# Patient Record
Sex: Male | Born: 1935 | Race: White | Hispanic: No | Marital: Married
Health system: Southern US, Community
[De-identification: ages and names within clinical notes are randomized; demographics above are authoritative.]

---

## 2001-07-16 ENCOUNTER — Ambulatory Visit (HOSPITAL_COMMUNITY): Admission: RE | Admit: 2001-07-16 | Discharge: 2001-07-16 | Payer: Self-pay | Admitting: Internal Medicine

## 2008-11-22 ENCOUNTER — Inpatient Hospital Stay: Admission: AD | Admit: 2008-11-22 | Discharge: 2008-12-22 | Payer: Self-pay | Admitting: Internal Medicine

## 2008-12-19 ENCOUNTER — Ambulatory Visit (HOSPITAL_COMMUNITY): Admission: RE | Admit: 2008-12-19 | Discharge: 2008-12-19 | Payer: Self-pay | Admitting: Internal Medicine

## 2008-12-20 ENCOUNTER — Ambulatory Visit (HOSPITAL_COMMUNITY): Admission: RE | Admit: 2008-12-20 | Discharge: 2008-12-20 | Payer: Self-pay | Admitting: Internal Medicine

## 2008-12-22 ENCOUNTER — Ambulatory Visit (HOSPITAL_COMMUNITY): Admission: RE | Admit: 2008-12-22 | Discharge: 2008-12-22 | Payer: Self-pay | Admitting: Internal Medicine

## 2008-12-22 ENCOUNTER — Inpatient Hospital Stay (HOSPITAL_COMMUNITY): Admission: EM | Admit: 2008-12-22 | Discharge: 2009-01-01 | Payer: Self-pay | Admitting: Emergency Medicine

## 2009-01-01 ENCOUNTER — Inpatient Hospital Stay: Admission: AD | Admit: 2009-01-01 | Discharge: 2009-06-13 | Payer: Self-pay | Admitting: Internal Medicine

## 2009-04-02 ENCOUNTER — Emergency Department (HOSPITAL_COMMUNITY): Admission: EM | Admit: 2009-04-02 | Discharge: 2009-04-02 | Payer: Self-pay | Admitting: Emergency Medicine

## 2009-04-09 ENCOUNTER — Ambulatory Visit (HOSPITAL_COMMUNITY): Admission: RE | Admit: 2009-04-09 | Discharge: 2009-04-09 | Payer: Self-pay | Admitting: Internal Medicine

## 2009-04-11 DIAGNOSIS — I495 Sick sinus syndrome: Secondary | ICD-10-CM | POA: Insufficient documentation

## 2009-04-11 DIAGNOSIS — G20A1 Parkinson's disease without dyskinesia, without mention of fluctuations: Secondary | ICD-10-CM | POA: Insufficient documentation

## 2009-04-11 DIAGNOSIS — N19 Unspecified kidney failure: Secondary | ICD-10-CM | POA: Insufficient documentation

## 2009-04-11 DIAGNOSIS — G20 Parkinson's disease: Secondary | ICD-10-CM

## 2009-04-11 DIAGNOSIS — E876 Hypokalemia: Secondary | ICD-10-CM

## 2009-04-11 DIAGNOSIS — Z87448 Personal history of other diseases of urinary system: Secondary | ICD-10-CM

## 2009-04-11 DIAGNOSIS — I4891 Unspecified atrial fibrillation: Secondary | ICD-10-CM

## 2009-04-11 DIAGNOSIS — J189 Pneumonia, unspecified organism: Secondary | ICD-10-CM

## 2009-04-18 ENCOUNTER — Ambulatory Visit: Payer: Self-pay | Admitting: Internal Medicine

## 2009-04-18 DIAGNOSIS — Z950 Presence of cardiac pacemaker: Secondary | ICD-10-CM

## 2009-04-20 ENCOUNTER — Encounter: Payer: Self-pay | Admitting: Cardiology

## 2009-05-14 ENCOUNTER — Emergency Department (HOSPITAL_COMMUNITY): Admission: EM | Admit: 2009-05-14 | Discharge: 2009-05-15 | Payer: Self-pay | Admitting: Emergency Medicine

## 2009-05-22 ENCOUNTER — Ambulatory Visit (HOSPITAL_COMMUNITY): Admission: RE | Admit: 2009-05-22 | Discharge: 2009-05-22 | Payer: Self-pay | Admitting: Internal Medicine

## 2009-06-13 ENCOUNTER — Inpatient Hospital Stay (HOSPITAL_COMMUNITY): Admission: EM | Admit: 2009-06-13 | Discharge: 2009-06-26 | Payer: Self-pay | Admitting: Emergency Medicine

## 2009-06-26 ENCOUNTER — Inpatient Hospital Stay: Admission: AD | Admit: 2009-06-26 | Discharge: 2009-07-09 | Payer: Self-pay | Admitting: Internal Medicine

## 2009-07-20 DEATH — deceased

## 2010-02-21 NOTE — Assessment & Plan Note (Signed)
Summary: per Dr.Roy Ouida Sills to evaluate for Pacemaker/tg   Visit Type:  Initial Consult Primary Provider:  Osborne Casco   History of Present Illness: Larry Copeland is referred today by Dr. Ouida Sills for ongoing PPM evaluation.  He has a h/o bradycardia and chronic atrial fibrillation and has been on coumadin for many years.  He recently moved from [place] to be closer to family.  He resides in the nursing facility.  The patient cannot speak except for minimal words and phrases.  His daughter in law who is with him states that she thinks he has been falling some.  Current Medications (verified): 1)  Protonix 40 Mg Tbec (Pantoprazole Sodium) .... Take 1 Tab Daily 2)  Sertraline Hcl 25 Mg Tabs (Sertraline Hcl) .... Take 1 Tab Daily 3)  Cardizem Cd 300 Mg Xr24h-Cap (Diltiazem Hcl Coated Beads) .... Take 1 Tab Daily 4)  Colace 100 Mg Caps (Docusate Sodium) .... Take 1 Tab Daily 5)  Aricept 10 Mg Tabs (Donepezil Hcl) .... Take 1 Tab Nightly 6)  Mucinex Dm 30-600 Mg Xr12h-Tab (Dextromethorphan-Guaifenesin) .... Take 1 Tab Two Times A Day 7)  Zebeta 5 Mg Tabs (Bisoprolol Fumarate) .... Take 1/2 Tab Daily 8)  Seroquel 25 Mg Tabs (Quetiapine Fumarate) .... Take As Needed 9)  Ipratropium-Albuterol 0.5-2.5 (3) Mg/53ml Soln (Ipratropium-Albuterol) .... Take As Directed 10)  Ziac 2.5-6.25 Mg Tabs (Bisoprolol-Hydrochlorothiazide) .... Take 1/2 Tab Daily 11)  Zoloft 25 Mg Tabs (Sertraline Hcl) .... Take 1 Tab Daily 12)  Jantoven 5 Mg Tabs (Warfarin Sodium) .... Take 1 Tab Daily  Allergies (verified): No Known Drug Allergies  Past History:  Past Medical History: Last updated: 04/11/2009 Current Problems:  HYPOKALEMIA (ICD-276.8) ATRIAL FIBRILLATION, CHRONIC (ICD-427.31) PARKINSON'S DISEASE (ICD-332.0) SICK SINUS SYNDROME (ICD-427.81) PNEUMONIA (ICD-486) RENAL FAILURE (ICD-586) URINARY TRACT OBSTRUCTION, HX OF (ICD-V13.09)  Past Surgical History: Last updated: 04/11/2009 pacemaker  placement indwelling catheter bladder tumor resection post left hemicolectomy  Family History: Last updated: 04/11/2009 Father:coronary artery disease Mother:cerebral hemorrhage  Social History: Last updated: 04/11/2009 Retired  Married  Tobacco Use - No.  Alcohol Use - no Regular Exercise - no Drug Use - no  Review of Systems       Unable to obtain ROS secondary to his dementia  Vital Signs:  Patient profile:   77 year old male Pulse rate:   59 / minute BP sitting:   115 / 65  (right arm)  Vitals Entered By: Dreama Saa, CNA (April 18, 2009 1:59 PM)  Physical Exam  General:  Elderly, demented in no acute distress.  HEENT: normal Neck: supple. No JVD. Carotids 2+ bilaterally no bruits EAV:WUJWJ no rubs, gallops or murmur Lungs: CTA. Well healed PPM incision. Ab: soft, nontender. nondistended. No HSM. Good bowel sounds Ext: warm. no cyanosis, clubbing or edema Neuro: alert but unable to answer questions. Occaisionally follows commands.    EKG  Procedure date:  04/18/2009  Findings:      Atrial fibrillation with a controlled ventricular response rate of: 60. Ventricle is paced intermittently   PPM Specifications Following MD:  Lewayne Bunting, MD     PPM Vendor:  [employer]     PPM Model Number:  [id number]     PPM Serial Number:  [id number] PPM DOI:  11/14/2008     PPM Implanting MD:  NOT IMPLANTED HERE  Lead 1    Location: RV     DOI: 05/08/2003     Model #: [id number]     Serial #: [id number]  Status: active  Magnet Response Rate:  BOL 98.6 ERI  86.3  Indications:  A-FIB   PPM Follow Up Remote Check?  No Battery Voltage:  2.78 V     Battery Est. Longevity:  7 years     Pacer Dependent:  No     Right Ventricle  Amplitude: 5.0 mV, Impedance: 303 ohms, Threshold: 0.625 V at 0.5 msec  Episodes Coumadin:  Yes Ventricular Pacing:  61%  Parameters Mode:  VVI     Lower Rate Limit:  60     Next Cardiology Appt Due:  09/20/2009 Tech Comments:  Auto capture on today.   New ID card order and to be sent to [facility]).  A-fib with controlled ventricular rates, + coumadin.  ROV 6 months in the [facility] clinic. Altha Harm, LPN  April 18, 2009 3:07 PM   MD Comments:  Agree with above.  Impression & Recommendations:  Problem # 1:  ATRIAL FIBRILLATION, CHRONIC (ICD-427.31) His ventricular rate is well controlled.  Continue meds as below except that if he continues to fall, coumadin will need to be stopped. The following medications were removed from the medication list:    Warfarin Sodium 7.5 Mg Tabs (Warfarin sodium) .Marland Kitchen... Take as directed His updated medication list for this problem includes:    Zebeta 5 Mg Tabs (Bisoprolol fumarate) .Marland Kitchen... Take 1/2 tab daily    Ziac 2.5-6.25 Mg Tabs (Bisoprolol-hydrochlorothiazide) .Marland Kitchen... Take 1/2 tab daily    Jantoven 5 Mg Tabs (Warfarin sodium) .Marland Kitchen... Take 1 tab daily  Problem # 2:  CARDIAC PACEMAKER IN SITU (ICD-V45.01) His device is working normally. Will recheck in several months.

## 2010-02-21 NOTE — Cardiovascular Report (Signed)
Summary: Card Device Clinic  Card Device Clinic   Imported By: Dreama Saa, CNA 04/20/2009 10:09:10  _____________________________________________________________________  External Attachment:    Type:   Image     Comment:   External Document

## 2010-04-08 LAB — BLOOD GAS, ARTERIAL
Acid-Base Excess: 0.9 mmol/L (ref 0.0–2.0)
Acid-base deficit: 2.8 mmol/L — ABNORMAL HIGH (ref 0.0–2.0)
Bicarbonate: 24.6 mEq/L — ABNORMAL HIGH (ref 20.0–24.0)
Bicarbonate: 21.9 mEq/L (ref 20.0–24.0)
O2 Saturation: 95.3 %
TCO2: 20.2 mmol/L (ref 0–100)
pCO2 arterial: 40.9 mmHg (ref 35.0–45.0)
pH, Arterial: 7.445 (ref 7.350–7.450)
pH, Arterial: 7.348 — ABNORMAL LOW (ref 7.350–7.450)
pO2, Arterial: 72.7 mmHg — ABNORMAL LOW (ref 80.0–100.0)

## 2010-04-08 LAB — BASIC METABOLIC PANEL
BUN: 14 mg/dL (ref 6–23)
BUN: 7 mg/dL (ref 6–23)
BUN: 7 mg/dL (ref 6–23)
BUN: 7 mg/dL (ref 6–23)
BUN: 8 mg/dL (ref 6–23)
BUN: 12 mg/dL (ref 6–23)
BUN: 17 mg/dL (ref 6–23)
BUN: 22 mg/dL (ref 6–23)
BUN: 9 mg/dL (ref 6–23)
CO2: 26 mEq/L (ref 19–32)
CO2: 26 mEq/L (ref 19–32)
CO2: 27 mEq/L (ref 19–32)
CO2: 22 mEq/L (ref 19–32)
CO2: 22 mEq/L (ref 19–32)
CO2: 22 mEq/L (ref 19–32)
CO2: 24 mEq/L (ref 19–32)
Calcium: 9.5 mg/dL (ref 8.4–10.5)
Calcium: 9.5 mg/dL (ref 8.4–10.5)
Calcium: 9.8 mg/dL (ref 8.4–10.5)
Calcium: 9.2 mg/dL (ref 8.4–10.5)
Calcium: 9.5 mg/dL (ref 8.4–10.5)
Chloride: 107 mEq/L (ref 96–112)
Chloride: 109 mEq/L (ref 96–112)
Chloride: 111 mEq/L (ref 96–112)
Chloride: 112 mEq/L (ref 96–112)
Chloride: 107 mEq/L (ref 96–112)
Chloride: 110 mEq/L (ref 96–112)
Chloride: 117 mEq/L — ABNORMAL HIGH (ref 96–112)
Creatinine, Ser: 0.97 mg/dL (ref 0.4–1.5)
Creatinine, Ser: 1.02 mg/dL (ref 0.4–1.5)
Creatinine, Ser: 1.13 mg/dL (ref 0.4–1.5)
Creatinine, Ser: 1.24 mg/dL (ref 0.4–1.5)
Creatinine, Ser: 0.74 mg/dL (ref 0.4–1.5)
Creatinine, Ser: 0.79 mg/dL (ref 0.4–1.5)
Creatinine, Ser: 0.87 mg/dL (ref 0.4–1.5)
GFR calc Af Amer: >60 mL/min (ref >60)
GFR calc Af Amer: >60 mL/min (ref >60)
GFR calc Af Amer: >60 mL/min (ref >60)
GFR calc non Af Amer: >60 mL/min (ref >60)
GFR calc non Af Amer: >60 mL/min (ref >60)
GFR calc non Af Amer: >60 mL/min (ref >60)
GFR calc non Af Amer: >60 mL/min (ref >60)
Glucose, Bld: 89 mg/dL (ref 70–99)
Glucose, Bld: 93 mg/dL (ref 70–99)
Glucose, Bld: 96 mg/dL (ref 70–99)
Glucose, Bld: 106 mg/dL — ABNORMAL HIGH (ref 70–99)
Glucose, Bld: 121 mg/dL — ABNORMAL HIGH (ref 70–99)
Glucose, Bld: 98 mg/dL (ref 70–99)
Potassium: 3.5 mEq/L (ref 3.5–5.1)
Potassium: 3 mEq/L — ABNORMAL LOW (ref 3.5–5.1)
Potassium: 3.3 mEq/L — ABNORMAL LOW (ref 3.5–5.1)
Potassium: 3.6 mEq/L (ref 3.5–5.1)
Potassium: 3.6 mEq/L (ref 3.5–5.1)
Potassium: 4.1 mEq/L (ref 3.5–5.1)
Sodium: 138 mEq/L (ref 135–145)
Sodium: 142 mEq/L (ref 135–145)

## 2010-04-08 LAB — CBC
HCT: 31.1 % — ABNORMAL LOW (ref 39.0–52.0)
HCT: 31.7 % — ABNORMAL LOW (ref 39.0–52.0)
HCT: 31.5 % — ABNORMAL LOW (ref 39.0–52.0)
HCT: 32.1 % — ABNORMAL LOW (ref 39.0–52.0)
HCT: 32.6 % — ABNORMAL LOW (ref 39.0–52.0)
HCT: 34.2 % — ABNORMAL LOW (ref 39.0–52.0)
Hemoglobin: 10.4 g/dL — ABNORMAL LOW (ref 13.0–17.0)
Hemoglobin: 10.4 g/dL — ABNORMAL LOW (ref 13.0–17.0)
Hemoglobin: 10.9 g/dL — ABNORMAL LOW (ref 13.0–17.0)
Hemoglobin: 11.2 g/dL — ABNORMAL LOW (ref 13.0–17.0)
Hemoglobin: 11.3 g/dL — ABNORMAL LOW (ref 13.0–17.0)
Hemoglobin: 12.2 g/dL — ABNORMAL LOW (ref 13.0–17.0)
MCHC: 32.9 g/dL (ref 30.0–36.0)
MCHC: 33.4 g/dL (ref 30.0–36.0)
MCHC: 32.9 g/dL (ref 30.0–36.0)
MCHC: 32.9 g/dL (ref 30.0–36.0)
MCHC: 33.3 g/dL (ref 30.0–36.0)
MCHC: 33.5 g/dL (ref 30.0–36.0)
MCV: 87.3 fL (ref 78.0–100.0)
MCV: 87.6 fL (ref 78.0–100.0)
MCV: 87.8 fL (ref 78.0–100.0)
MCV: 86.5 fL (ref 78.0–100.0)
MCV: 87.4 fL (ref 78.0–100.0)
MCV: 88 fL (ref 78.0–100.0)
MCV: 88.2 fL (ref 78.0–100.0)
Platelets: 190 10*3/uL (ref 150–400)
Platelets: 210 10*3/uL (ref 150–400)
Platelets: 212 10*3/uL (ref 150–400)
RBC: 3.61 MIL/uL — ABNORMAL LOW (ref 4.22–5.81)
RBC: 4.01 MIL/uL — ABNORMAL LOW (ref 4.22–5.81)
RBC: 3.77 MIL/uL — ABNORMAL LOW (ref 4.22–5.81)
RBC: 3.89 MIL/uL — ABNORMAL LOW (ref 4.22–5.81)
RBC: 4.22 MIL/uL (ref 4.22–5.81)
RDW: 16.3 % — ABNORMAL HIGH (ref 11.5–15.5)
RDW: 16.6 % — ABNORMAL HIGH (ref 11.5–15.5)
RDW: 16.3 % — ABNORMAL HIGH (ref 11.5–15.5)
RDW: 16.3 % — ABNORMAL HIGH (ref 11.5–15.5)
RDW: 16.3 % — ABNORMAL HIGH (ref 11.5–15.5)
RDW: 16.6 % — ABNORMAL HIGH (ref 11.5–15.5)
WBC: 7.3 10*3/uL (ref 4.0–10.5)
WBC: 7.5 10*3/uL (ref 4.0–10.5)
WBC: 7.7 10*3/uL (ref 4.0–10.5)
WBC: 10.9 10*3/uL — ABNORMAL HIGH (ref 4.0–10.5)
WBC: 5.5 10*3/uL (ref 4.0–10.5)
WBC: 7.6 10*3/uL (ref 4.0–10.5)
WBC: 8.3 10*3/uL (ref 4.0–10.5)

## 2010-04-08 LAB — DIFFERENTIAL
Band Neutrophils: 0 % (ref 0–10)
Basophils Absolute: 0 10*3/uL (ref 0.0–0.1)
Basophils Absolute: 0 10*3/uL (ref 0.0–0.1)
Basophils Absolute: 0 10*3/uL (ref 0.0–0.1)
Basophils Absolute: 0 10*3/uL (ref 0.0–0.1)
Basophils Absolute: 0 10*3/uL (ref 0.0–0.1)
Basophils Absolute: 0 10*3/uL (ref 0.0–0.1)
Basophils Relative: 0 % (ref 0–1)
Basophils Relative: 0 % (ref 0–1)
Basophils Relative: 0 % (ref 0–1)
Basophils Relative: 0 % (ref 0–1)
Basophils Relative: 0 % (ref 0–1)
Basophils Relative: 0 % (ref 0–1)
Basophils Relative: 1 % (ref 0–1)
Basophils Relative: 1 % (ref 0–1)
Blasts: 0 %
Eosinophils Absolute: 0.5 10*3/uL (ref 0.0–0.7)
Eosinophils Absolute: 0.6 10*3/uL (ref 0.0–0.7)
Eosinophils Absolute: 0.8 10*3/uL — ABNORMAL HIGH (ref 0.0–0.7)
Eosinophils Absolute: 0 10*3/uL (ref 0.0–0.7)
Eosinophils Absolute: 0.1 10*3/uL (ref 0.0–0.7)
Eosinophils Absolute: 0.5 10*3/uL (ref 0.0–0.7)
Eosinophils Absolute: 0.6 10*3/uL (ref 0.0–0.7)
Eosinophils Relative: 13 % — ABNORMAL HIGH (ref 0–5)
Eosinophils Relative: 8 % — ABNORMAL HIGH (ref 0–5)
Eosinophils Relative: 0 % (ref 0–5)
Eosinophils Relative: 0 % (ref 0–5)
Eosinophils Relative: 10 % — ABNORMAL HIGH (ref 0–5)
Lymphocytes Relative: 15 % (ref 12–46)
Lymphocytes Relative: 9 % — ABNORMAL LOW (ref 12–46)
Lymphs Abs: 1 10*3/uL (ref 0.7–4.0)
Lymphs Abs: 0.8 10*3/uL (ref 0.7–4.0)
Lymphs Abs: 0.8 10*3/uL (ref 0.7–4.0)
Lymphs Abs: 0.9 10*3/uL (ref 0.7–4.0)
Metamyelocytes Relative: 0 %
Monocytes Absolute: 0.7 10*3/uL (ref 0.1–1.0)
Monocytes Absolute: 0.8 10*3/uL (ref 0.1–1.0)
Monocytes Absolute: 0.8 10*3/uL (ref 0.1–1.0)
Monocytes Absolute: 0.9 10*3/uL (ref 0.1–1.0)
Monocytes Absolute: 0.7 10*3/uL (ref 0.1–1.0)
Monocytes Absolute: 0.7 10*3/uL (ref 0.1–1.0)
Monocytes Absolute: 0.8 10*3/uL (ref 0.1–1.0)
Monocytes Absolute: 0.8 10*3/uL (ref 0.1–1.0)
Monocytes Relative: 10 % (ref 3–12)
Monocytes Relative: 10 % (ref 3–12)
Monocytes Relative: 12 % (ref 3–12)
Monocytes Relative: 11 % (ref 3–12)
Monocytes Relative: 13 % — ABNORMAL HIGH (ref 3–12)
Monocytes Relative: 4 % (ref 3–12)
Myelocytes: 0 %
Neutro Abs: 4.3 10*3/uL (ref 1.7–7.7)
Neutro Abs: 3.4 10*3/uL (ref 1.7–7.7)
Neutro Abs: 6.4 10*3/uL (ref 1.7–7.7)
Neutrophils Relative %: 61 % (ref 43–77)
Neutrophils Relative %: 64 % (ref 43–77)
Neutrophils Relative %: 68 % (ref 43–77)
Neutrophils Relative %: 60 % (ref 43–77)
Neutrophils Relative %: 68 % (ref 43–77)
Neutrophils Relative %: 77 % (ref 43–77)
Neutrophils Relative %: 86 % — ABNORMAL HIGH (ref 43–77)
nRBC: 0 /100 WBC

## 2010-04-08 LAB — PROTIME-INR
INR: 1.42 (ref 0.00–1.49)
INR: 3.19 — ABNORMAL HIGH (ref 0.00–1.49)
INR: 3.54 — ABNORMAL HIGH (ref 0.00–1.49)
INR: 4.11 — ABNORMAL HIGH (ref 0.00–1.49)
Prothrombin Time: 35.2 seconds — ABNORMAL HIGH (ref 11.6–15.2)
Prothrombin Time: 42.9 seconds — ABNORMAL HIGH (ref 11.6–15.2)

## 2010-04-08 LAB — COMPREHENSIVE METABOLIC PANEL
ALT: 11 U/L (ref 0–53)
Alkaline Phosphatase: 103 U/L (ref 39–117)
CO2: 25 mEq/L (ref 19–32)
Chloride: 109 mEq/L (ref 96–112)
GFR calc non Af Amer: >60 mL/min (ref >60)
Glucose, Bld: 134 mg/dL — ABNORMAL HIGH (ref 70–99)
Potassium: 4.2 mEq/L (ref 3.5–5.1)
Sodium: 141 mEq/L (ref 135–145)
Total Bilirubin: 0.5 mg/dL (ref 0.3–1.2)
Total Protein: 7.5 g/dL (ref 6.0–8.3)

## 2010-04-08 LAB — CULTURE, BLOOD (ROUTINE X 2)
Culture: NO GROWTH
Report Status: 5302011
Report Status: 5302011

## 2010-04-08 LAB — URINE CULTURE: Colony Count: 30000

## 2010-04-08 LAB — VANCOMYCIN, TROUGH: Vancomycin Tr: 29.6 ug/mL (ref 10.0–20.0)

## 2010-04-08 LAB — URINALYSIS, ROUTINE W REFLEX MICROSCOPIC
Bilirubin Urine: NEGATIVE
Ketones, ur: NEGATIVE mg/dL
Nitrite: NEGATIVE
Urobilinogen, UA: 0.2 mg/dL (ref 0.0–1.0)

## 2010-04-08 LAB — POCT CARDIAC MARKERS: CKMB, poc: <1 ng/mL — ABNORMAL LOW (ref 1.0–8.0)

## 2010-04-08 LAB — URINE MICROSCOPIC-ADD ON

## 2010-04-08 LAB — BRAIN NATRIURETIC PEPTIDE: Pro B Natriuretic peptide (BNP): 68.2 pg/mL (ref 0.0–100.0)

## 2010-04-09 LAB — URINALYSIS, ROUTINE W REFLEX MICROSCOPIC
Bilirubin Urine: NEGATIVE
Ketones, ur: NEGATIVE mg/dL
Nitrite: NEGATIVE
Protein, ur: 100 mg/dL — AB
pH: 6.5 (ref 5.0–8.0)

## 2010-04-09 LAB — URINE CULTURE
Colony Count: NO GROWTH
Culture: NO GROWTH

## 2010-04-09 LAB — URINE MICROSCOPIC-ADD ON

## 2010-04-23 LAB — BASIC METABOLIC PANEL
BUN: 10 mg/dL (ref 6–23)
BUN: 15 mg/dL (ref 6–23)
CO2: 22 mEq/L (ref 19–32)
CO2: 23 mEq/L (ref 19–32)
CO2: 23 mEq/L (ref 19–32)
CO2: 24 mEq/L (ref 19–32)
CO2: 25 mEq/L (ref 19–32)
CO2: 26 mEq/L (ref 19–32)
CO2: 27 mEq/L (ref 19–32)
Calcium: 8.5 mg/dL (ref 8.4–10.5)
Calcium: 8.8 mg/dL (ref 8.4–10.5)
Calcium: 8.9 mg/dL (ref 8.4–10.5)
Calcium: 9 mg/dL (ref 8.4–10.5)
Calcium: 9.2 mg/dL (ref 8.4–10.5)
Chloride: 107 mEq/L (ref 96–112)
Chloride: 109 mEq/L (ref 96–112)
Chloride: 110 mEq/L (ref 96–112)
Creatinine, Ser: 0.77 mg/dL (ref 0.4–1.5)
Creatinine, Ser: 0.85 mg/dL (ref 0.4–1.5)
Creatinine, Ser: 0.91 mg/dL (ref 0.4–1.5)
Creatinine, Ser: 2.2 mg/dL — ABNORMAL HIGH (ref 0.4–1.5)
GFR calc Af Amer: 35 mL/min — ABNORMAL LOW (ref >60)
GFR calc Af Amer: >60 mL/min (ref >60)
GFR calc Af Amer: >60 mL/min (ref >60)
GFR calc Af Amer: >60 mL/min (ref >60)
GFR calc Af Amer: >60 mL/min (ref >60)
GFR calc Af Amer: >60 mL/min (ref >60)
GFR calc Af Amer: >60 mL/min (ref >60)
GFR calc non Af Amer: 29 mL/min — ABNORMAL LOW (ref >60)
GFR calc non Af Amer: >60 mL/min (ref >60)
GFR calc non Af Amer: >60 mL/min (ref >60)
GFR calc non Af Amer: >60 mL/min (ref >60)
GFR calc non Af Amer: >60 mL/min (ref >60)
GFR calc non Af Amer: >60 mL/min (ref >60)
Glucose, Bld: 81 mg/dL (ref 70–99)
Glucose, Bld: 81 mg/dL (ref 70–99)
Glucose, Bld: 92 mg/dL (ref 70–99)
Glucose, Bld: 96 mg/dL (ref 70–99)
Potassium: 2.8 mEq/L — ABNORMAL LOW (ref 3.5–5.1)
Potassium: 3.4 mEq/L — ABNORMAL LOW (ref 3.5–5.1)
Potassium: 3.9 mEq/L (ref 3.5–5.1)
Sodium: 136 mEq/L (ref 135–145)
Sodium: 136 mEq/L (ref 135–145)
Sodium: 136 mEq/L (ref 135–145)
Sodium: 138 mEq/L (ref 135–145)
Sodium: 140 mEq/L (ref 135–145)
Sodium: 140 mEq/L (ref 135–145)
Sodium: 142 mEq/L (ref 135–145)

## 2010-04-23 LAB — DIFFERENTIAL
Basophils Absolute: 0 10*3/uL (ref 0.0–0.1)
Basophils Absolute: 0 10*3/uL (ref 0.0–0.1)
Basophils Relative: 0 % (ref 0–1)
Eosinophils Relative: 0 % (ref 0–5)
Eosinophils Relative: 5 % (ref 0–5)
Lymphocytes Relative: 2 % — ABNORMAL LOW (ref 12–46)
Lymphocytes Relative: 9 % — ABNORMAL LOW (ref 12–46)
Lymphocytes Relative: 9 % — ABNORMAL LOW (ref 12–46)
Lymphs Abs: 0.9 10*3/uL (ref 0.7–4.0)
Lymphs Abs: 0.9 10*3/uL (ref 0.7–4.0)
Monocytes Absolute: 0.7 10*3/uL (ref 0.1–1.0)
Monocytes Relative: 5 % (ref 3–12)
Neutro Abs: 19.4 10*3/uL — ABNORMAL HIGH (ref 1.7–7.7)
Neutro Abs: 8.4 10*3/uL — ABNORMAL HIGH (ref 1.7–7.7)
Neutro Abs: 8.4 10*3/uL — ABNORMAL HIGH (ref 1.7–7.7)
Neutrophils Relative %: 84 % — ABNORMAL HIGH (ref 43–77)

## 2010-04-23 LAB — COMPREHENSIVE METABOLIC PANEL
AST: 21 U/L (ref 0–37)
Alkaline Phosphatase: 105 U/L (ref 39–117)
BUN: 62 mg/dL — ABNORMAL HIGH (ref 6–23)
CO2: 23 mEq/L (ref 19–32)
Chloride: 108 mEq/L (ref 96–112)
Creatinine, Ser: 3.63 mg/dL — ABNORMAL HIGH (ref 0.4–1.5)
GFR calc non Af Amer: 16 mL/min — ABNORMAL LOW (ref >60)
Potassium: 4.4 mEq/L (ref 3.5–5.1)
Total Bilirubin: 0.5 mg/dL (ref 0.3–1.2)

## 2010-04-23 LAB — GLUCOSE, CAPILLARY: Glucose-Capillary: 86 mg/dL (ref 70–99)

## 2010-04-23 LAB — CBC
HCT: 31.3 % — ABNORMAL LOW (ref 39.0–52.0)
HCT: 32 % — ABNORMAL LOW (ref 39.0–52.0)
HCT: 34.8 % — ABNORMAL LOW (ref 39.0–52.0)
Hemoglobin: 10.5 g/dL — ABNORMAL LOW (ref 13.0–17.0)
MCV: 89.9 fL (ref 78.0–100.0)
Platelets: 292 10*3/uL (ref 150–400)
Platelets: 315 10*3/uL (ref 150–400)
RBC: 3.86 MIL/uL — ABNORMAL LOW (ref 4.22–5.81)
RBC: 4.26 MIL/uL (ref 4.22–5.81)
RDW: 16.5 % — ABNORMAL HIGH (ref 11.5–15.5)
RDW: 16.6 % — ABNORMAL HIGH (ref 11.5–15.5)
WBC: 10.1 10*3/uL (ref 4.0–10.5)
WBC: 10.6 10*3/uL — ABNORMAL HIGH (ref 4.0–10.5)
WBC: 16 10*3/uL — ABNORMAL HIGH (ref 4.0–10.5)
WBC: 21 10*3/uL — ABNORMAL HIGH (ref 4.0–10.5)

## 2010-04-23 LAB — PROTIME-INR
INR: 1.54 — ABNORMAL HIGH (ref 0.00–1.49)
INR: 1.59 — ABNORMAL HIGH (ref 0.00–1.49)
INR: 2.62 — ABNORMAL HIGH (ref 0.00–1.49)
INR: 3.04 — ABNORMAL HIGH (ref 0.00–1.49)
INR: 3.11 — ABNORMAL HIGH (ref 0.00–1.49)
Prothrombin Time: 18.4 seconds — ABNORMAL HIGH (ref 11.6–15.2)
Prothrombin Time: 18.8 seconds — ABNORMAL HIGH (ref 11.6–15.2)
Prothrombin Time: 19.4 seconds — ABNORMAL HIGH (ref 11.6–15.2)
Prothrombin Time: 19.8 seconds — ABNORMAL HIGH (ref 11.6–15.2)
Prothrombin Time: 31.8 seconds — ABNORMAL HIGH (ref 11.6–15.2)
Prothrombin Time: 32.5 seconds — ABNORMAL HIGH (ref 11.6–15.2)

## 2010-04-23 LAB — CULTURE, BLOOD (ROUTINE X 2)
Culture: NO GROWTH
Report Status: 12082010
Report Status: 12082010

## 2010-04-23 LAB — APTT: aPTT: 67 seconds — ABNORMAL HIGH (ref 24–37)

## 2010-04-23 LAB — VANCOMYCIN, TROUGH: Vancomycin Tr: 21.6 ug/mL — ABNORMAL HIGH (ref 10.0–20.0)

## 2010-04-23 LAB — URINALYSIS, ROUTINE W REFLEX MICROSCOPIC
Bilirubin Urine: NEGATIVE
Urobilinogen, UA: 0.2 mg/dL (ref 0.0–1.0)

## 2010-04-23 LAB — GENTAMICIN LEVEL, RANDOM: Gentamicin Rm: 7.2 ug/mL

## 2011-01-02 IMAGING — US US SOFT TISSUE HEAD/NECK
1 series · 14 of 25 positions shown · non-contrast
Comparison: Cervical spine CT 04/02/2009

CLINICAL DATA: Thyroid mass.

THYROID ULTRASOUND
TECHNIQUE: Ultrasound examination of the thyroid gland and
adjacent soft tissues was performed.

[Series 1: us soft tissue head/neck · 0.08mm/px · 14 of 27 slices shown]
[im 1/27]
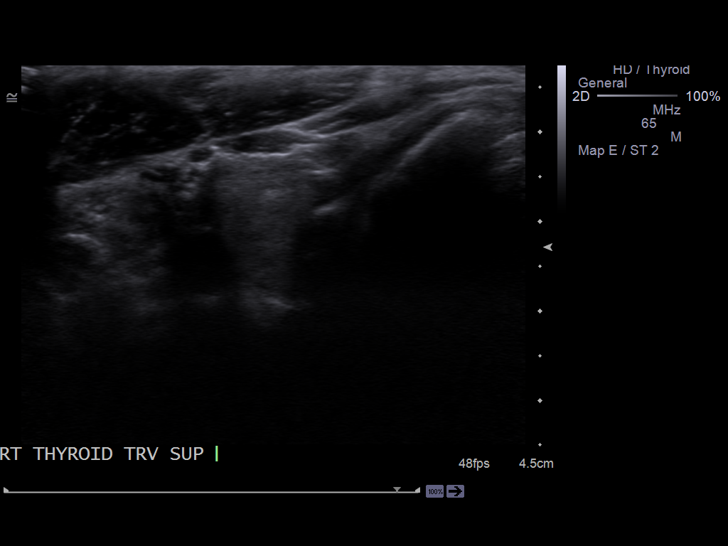
[im 3/27]
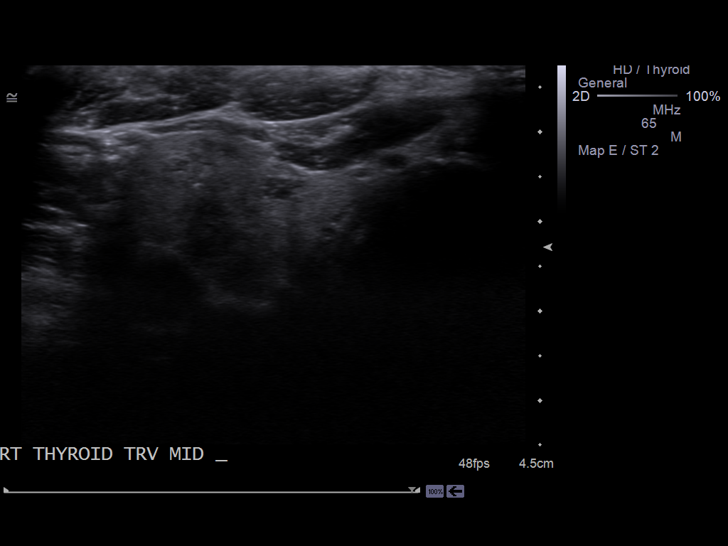
[im 5/27]
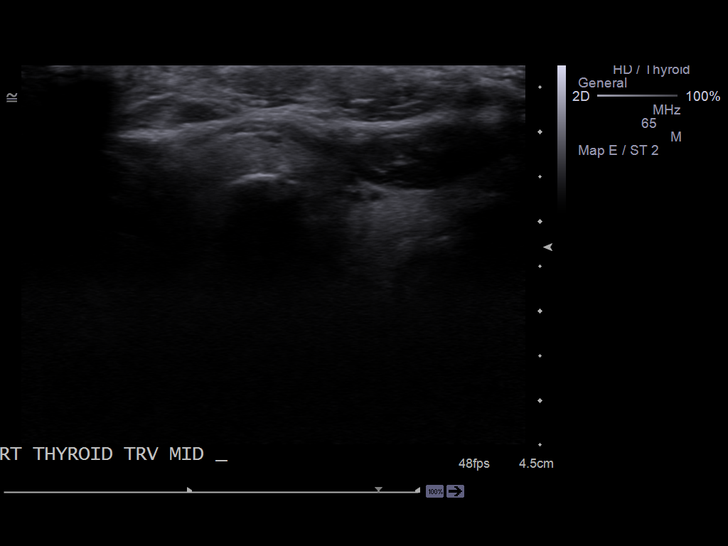
[im 7/27]
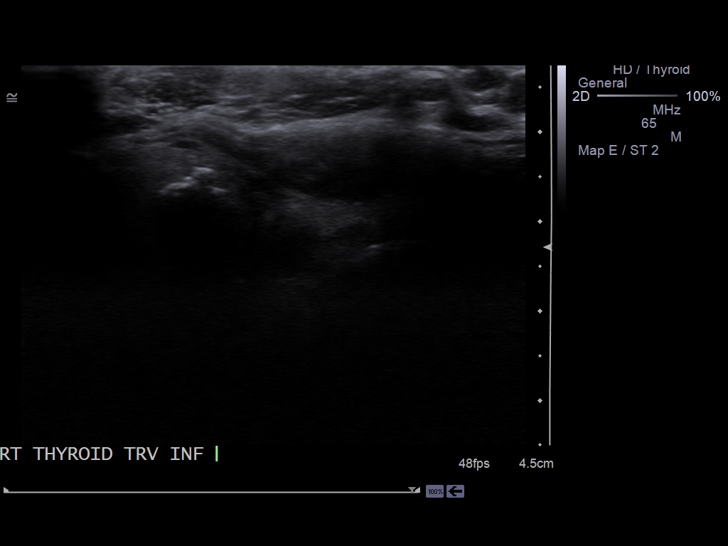
[im 9/27]
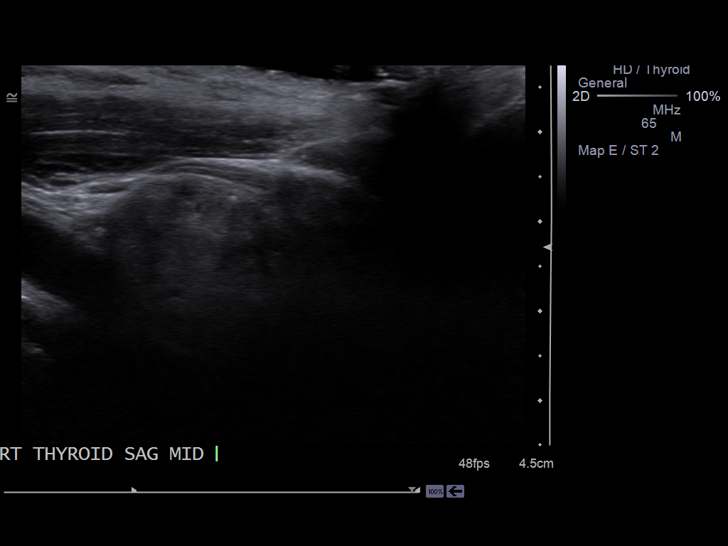
[im 10/27]
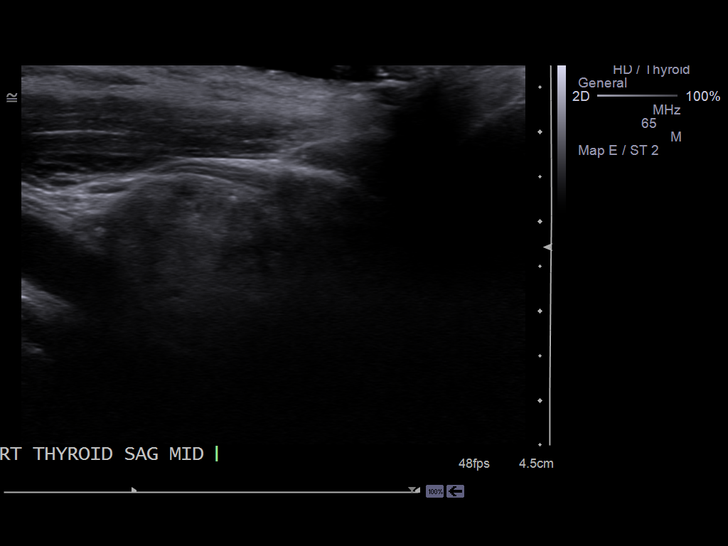
[im 12/27]
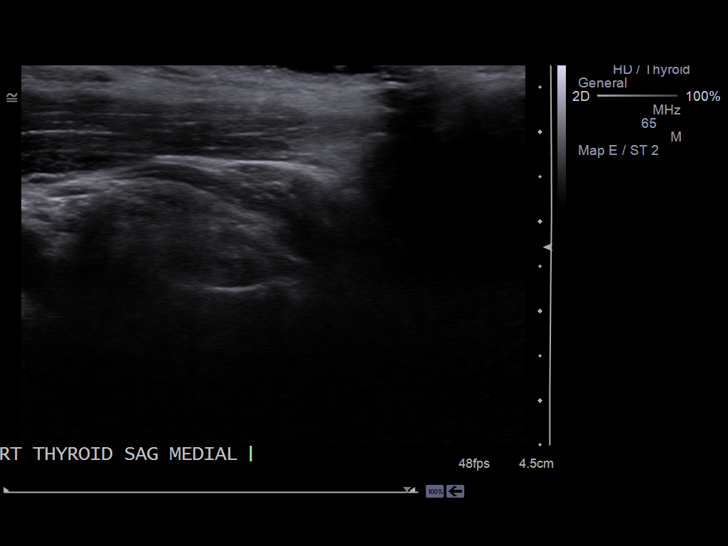
[im 15/27]
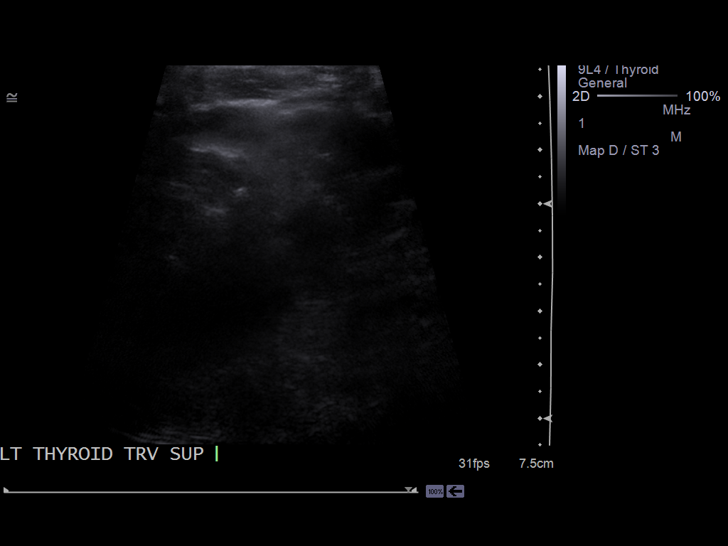
[im 17/27]
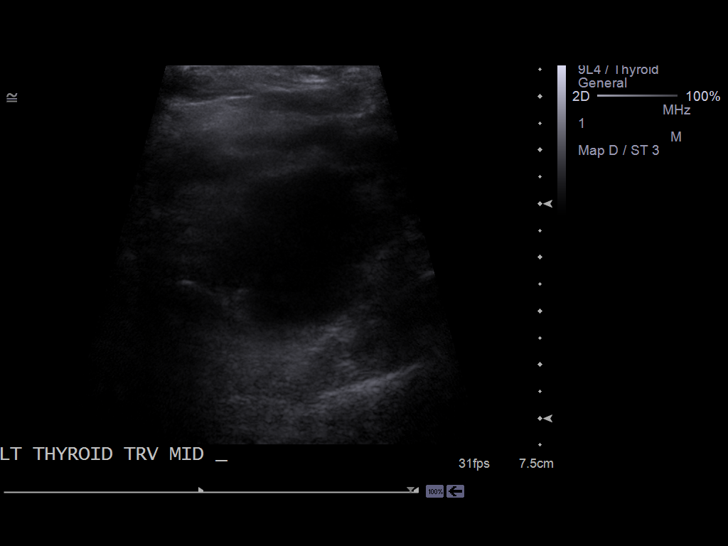
[im 18/27]
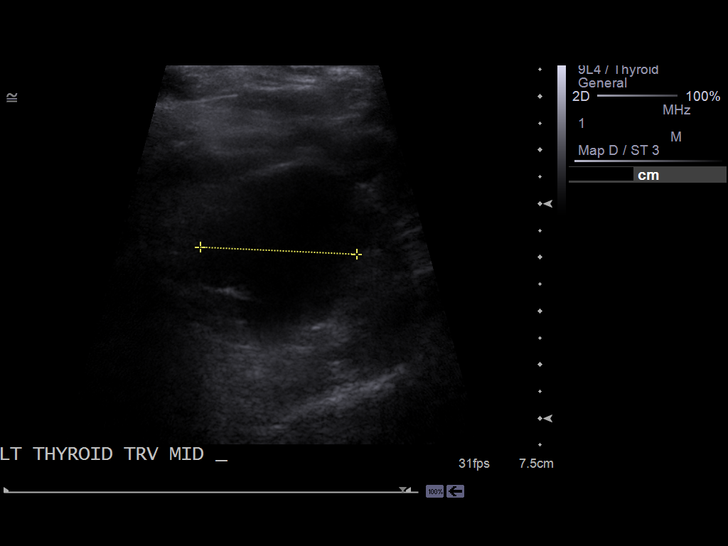
[im 20/27]
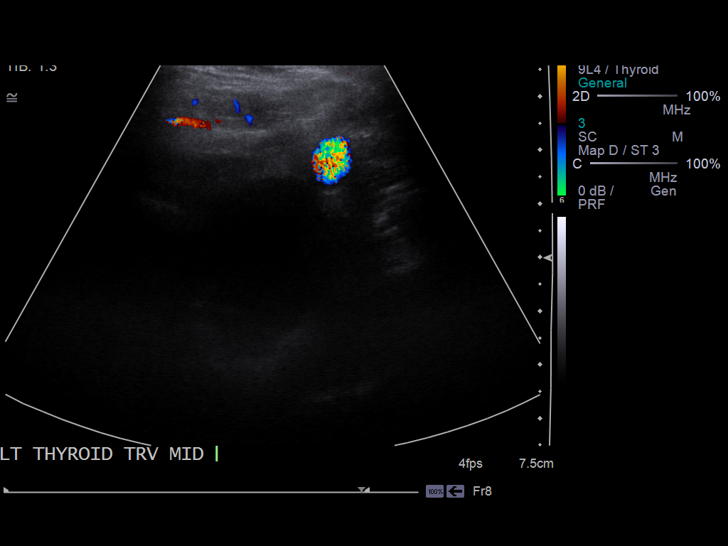
[im 22/27]
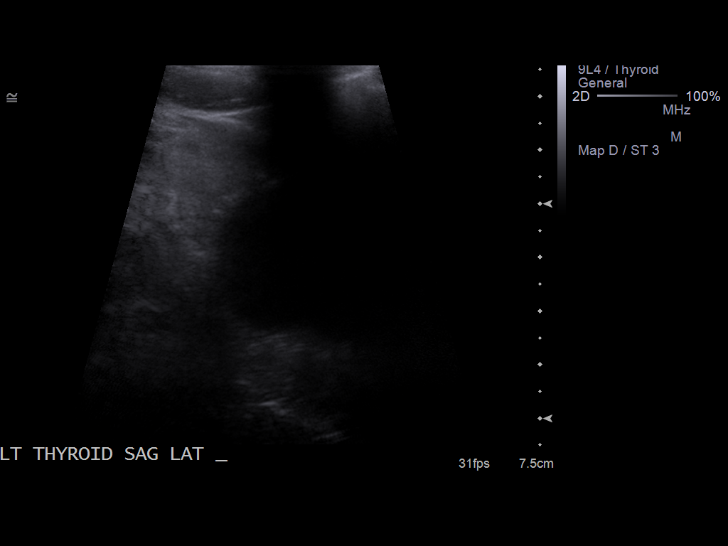
[im 24/27]
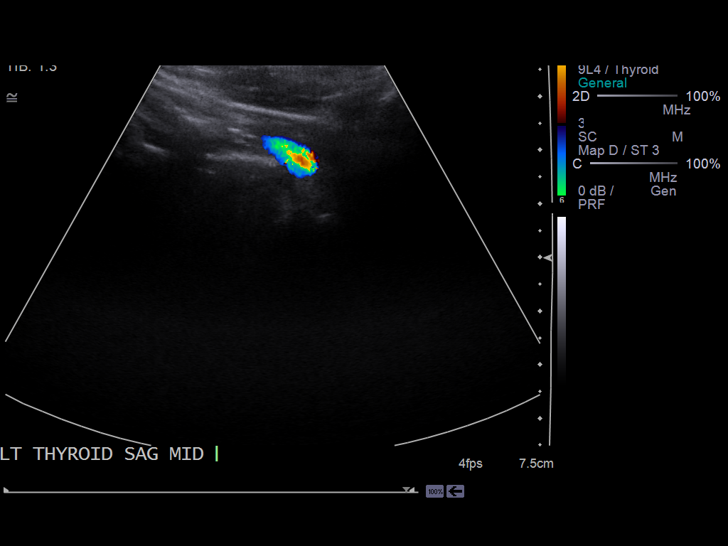
[im 27/27]
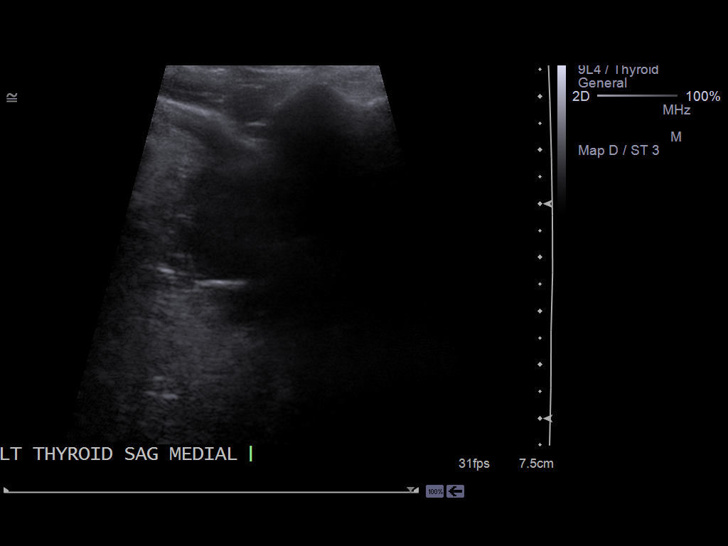

[14 of 25 positions shown; findings below may reference images not displayed]

FINDINGS: The thyroid is largely obscured by the clavicular heads.
Additionally, imaging is suboptimal due to the patient's inability
to extend and move the head and neck.  The width of the right
thyroid is 2.3 cm.  The width of the left thyroid is 4.6 cm.
Isthmus measures 3 mm.  A cystic nodule is seen in the left
thyroid, at the junction of the mid and lower poles, measuring
approximately 2.9 cm.  A cystic lesion with rim calcification is
seen in the right thyroid, but is largely obscured by the patient's
clavicle.
IMPRESSION: Cystic lesions are seen in the thyroid bilaterally, but assessment
is limited for reasons given above.

## 2011-03-15 IMAGING — CR DG CHEST 1V PORT
1 series · 1 of 1 positions shown · non-contrast
Comparison: Portable exam 7477 hours compared to 06/13/2009

CLINICAL DATA: Cough, low oxygen saturation

PORTABLE CHEST - 1 VIEW

[view not recorded]
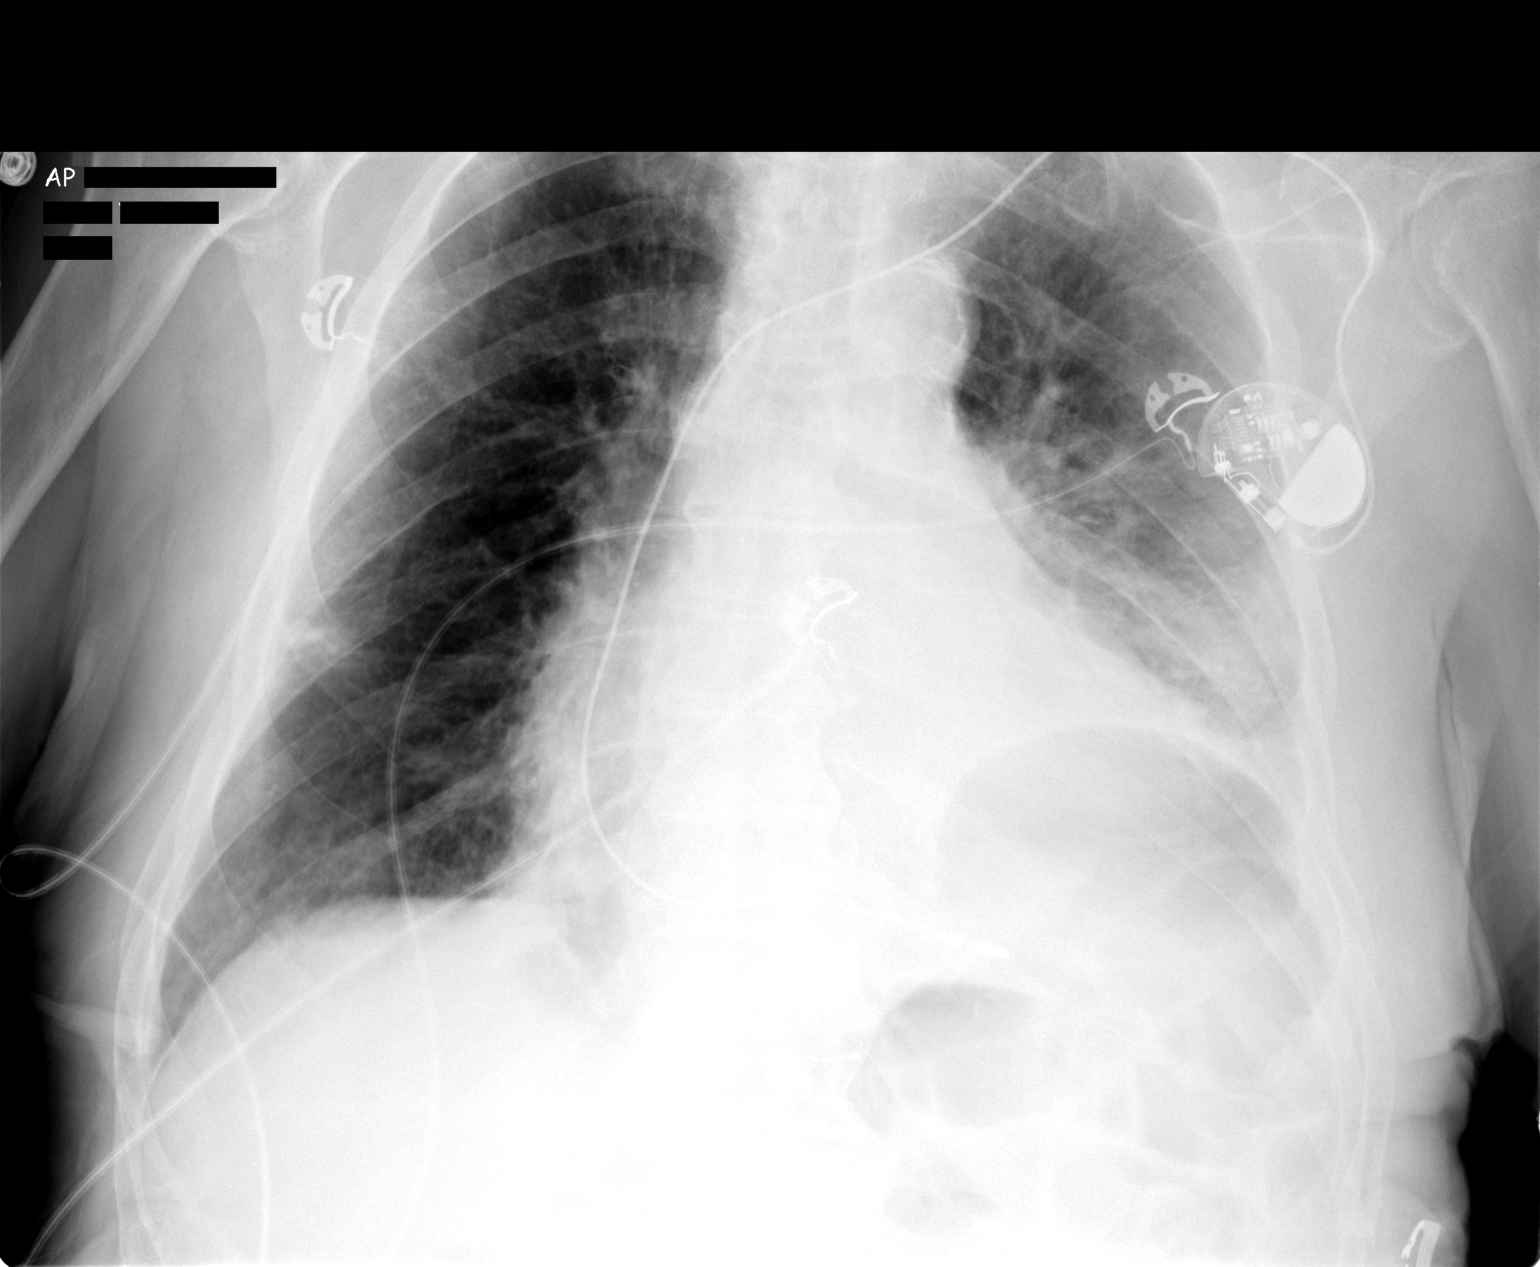

[1 of 1 positions shown; findings below may reference images not displayed]

FINDINGS: Rotated to the left.
Mild cardiac enlargement.
Left subclavian transvenous pacemaker lead tip projects over right
ventricle.
Persistent elevation of left diaphragm.
Atherosclerotic calcifications aortic arch.
Increased opacity in left lower lobe either representing
consolidation or atelectasis.
Underlying COPD with minimal atelectasis mid right lung laterally.
Remaining lungs clear.
Mild bronchitic changes.
IMPRESSION: Increased left lower lobe atelectasis versus consolidation.
Cardiomegaly and underlying COPD.

## 2011-03-16 IMAGING — CR DG CHEST 1V PORT
1 series · 1 of 1 positions shown · non-contrast
Comparison: 06/20/2009

CLINICAL DATA: Pneumonia and low oxygen saturations.

PORTABLE CHEST - 1 VIEW

[view not recorded]
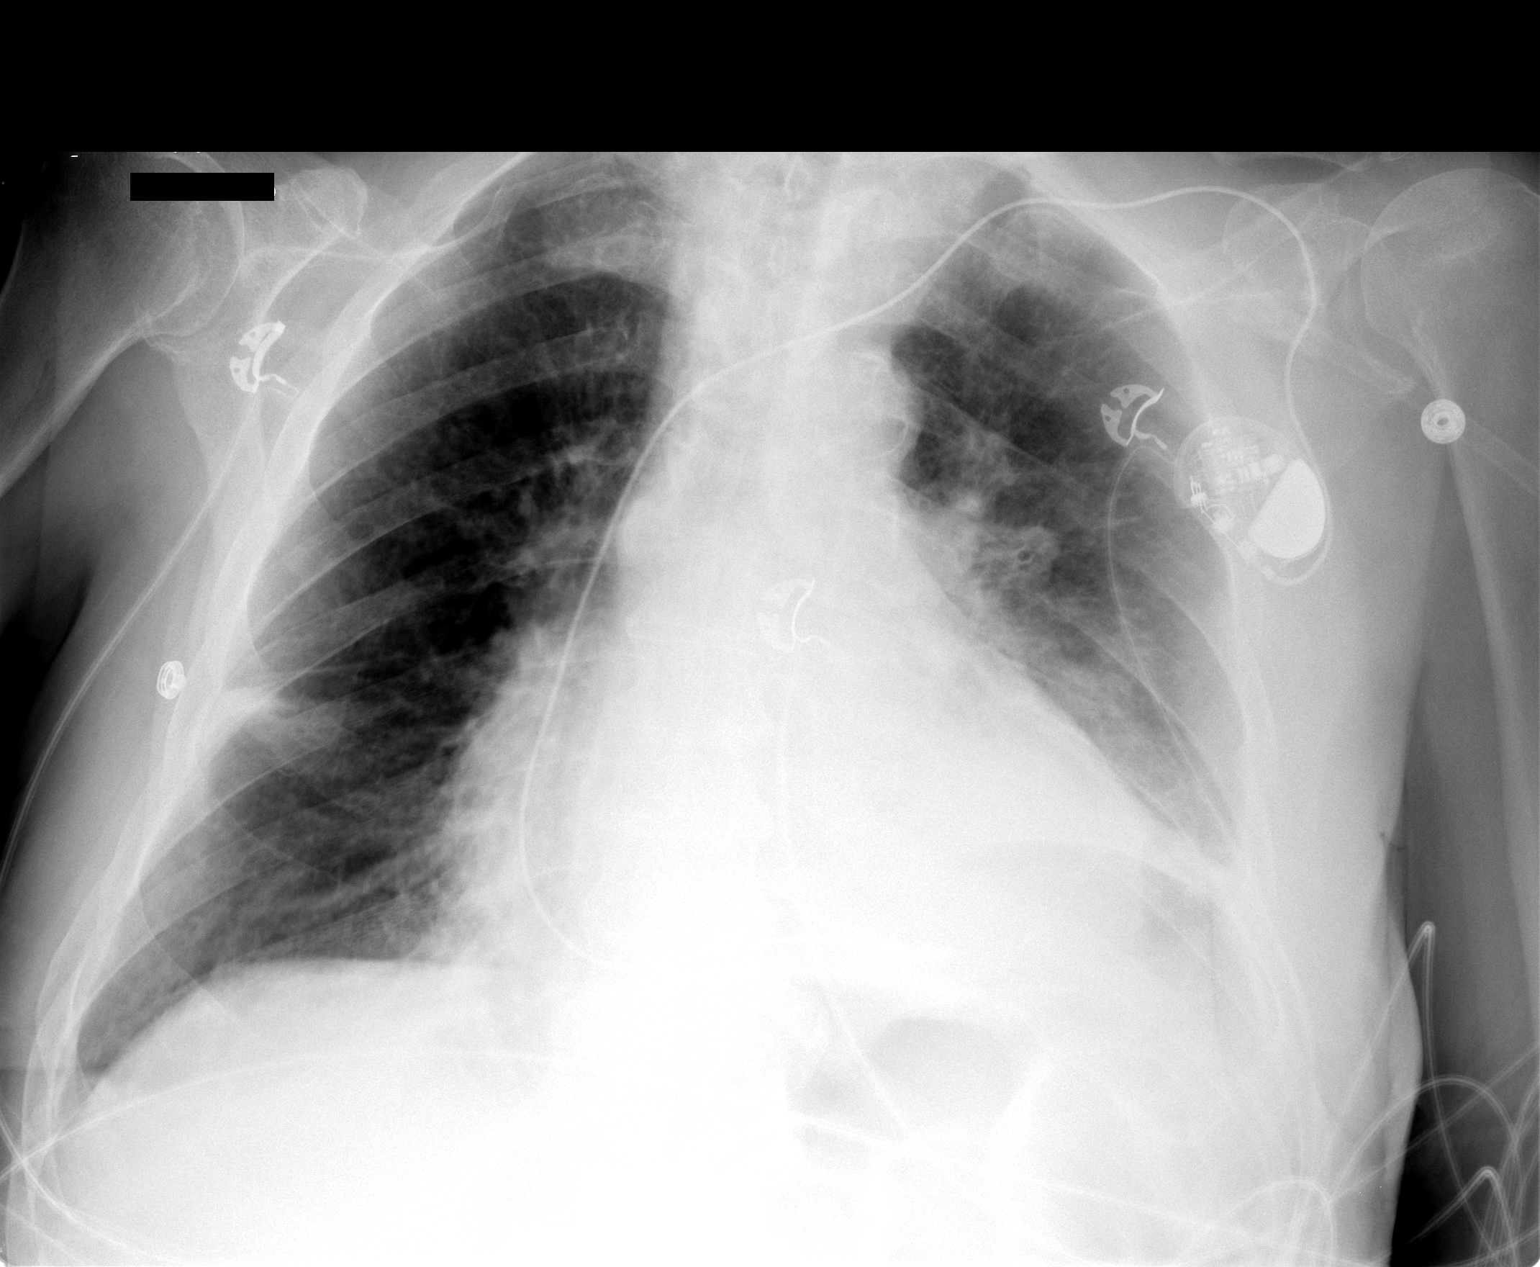

[1 of 1 positions shown; findings below may reference images not displayed]

FINDINGS: Portable semi upright view of the chest was obtained.
Increased density in the right mid lung concerning for a focus of
airspace disease.  Enlargement of the cardiac silhouette with hazy
interstitial densities in the left lung base.  There is a left
single lead cardiac pacemaker.
IMPRESSION: Increased densities in the right lower lung with persistent hazy
densities in the left lung.  Findings are concerning for an
infectious etiology.

## 2018-09-16 ENCOUNTER — Telehealth: Payer: Self-pay | Admitting: Pulmonary Disease

## 2018-09-16 NOTE — Telephone Encounter (Signed)
Entered chart in error.

## 2020-10-16 ENCOUNTER — Other Ambulatory Visit (HOSPITAL_COMMUNITY): Payer: Self-pay

## 2020-11-13 ENCOUNTER — Other Ambulatory Visit (HOSPITAL_COMMUNITY): Payer: Self-pay

## 2021-01-22 ENCOUNTER — Other Ambulatory Visit (HOSPITAL_COMMUNITY): Payer: Self-pay
